# Patient Record
Sex: Female | Born: 1990 | Race: White | Hispanic: No | Smoking: Never smoker
Health system: Southern US, Community
[De-identification: ages and names within clinical notes are randomized; demographics above are authoritative.]

---

## 2015-08-06 ENCOUNTER — Emergency Department (HOSPITAL_COMMUNITY): Payer: Medicaid - Out of State

## 2015-08-06 ENCOUNTER — Emergency Department (HOSPITAL_COMMUNITY)
Admission: EM | Admit: 2015-08-06 | Discharge: 2015-08-06 | Disposition: A | Discharge status: Home / Self Care | Payer: Medicaid - Out of State | Attending: Emergency Medicine | Admitting: Emergency Medicine

## 2015-08-06 ENCOUNTER — Encounter (HOSPITAL_COMMUNITY): Payer: Self-pay | Admitting: Emergency Medicine

## 2015-08-06 DIAGNOSIS — Y998 Other external cause status: Secondary | ICD-10-CM | POA: Diagnosis not present

## 2015-08-06 DIAGNOSIS — M25561 Pain in right knee: Secondary | ICD-10-CM

## 2015-08-06 DIAGNOSIS — Y9389 Activity, other specified: Secondary | ICD-10-CM | POA: Insufficient documentation

## 2015-08-06 DIAGNOSIS — X501XXA Overexertion from prolonged static or awkward postures, initial encounter: Secondary | ICD-10-CM | POA: Diagnosis not present

## 2015-08-06 DIAGNOSIS — Y9289 Other specified places as the place of occurrence of the external cause: Secondary | ICD-10-CM | POA: Insufficient documentation

## 2015-08-06 DIAGNOSIS — S8991XA Unspecified injury of right lower leg, initial encounter: Secondary | ICD-10-CM | POA: Diagnosis not present

## 2015-08-06 MED ORDER — HYDROCODONE-ACETAMINOPHEN 5-325 MG PO TABS: 1.0000 | ORAL_TABLET | Freq: Once | ORAL | Status: DC

## 2015-08-06 MED ORDER — IBUPROFEN 400 MG PO TABS
600.0000 mg | ORAL_TABLET | Freq: Once | ORAL | Status: AC
  Administered 2015-08-06: 600 mg via ORAL
  Filled 2015-08-06: qty 1

## 2015-08-06 MED ORDER — HYDROCODONE-ACETAMINOPHEN 5-325 MG PO TABS: 1.0000 | ORAL_TABLET | Freq: Four times a day (QID) | ORAL | Status: AC | PRN

## 2015-08-06 MED ORDER — IBUPROFEN 600 MG PO TABS: 600.0000 mg | ORAL_TABLET | Freq: Three times a day (TID) | ORAL | Status: AC

## 2015-08-06 NOTE — ED Provider Notes (Signed)
CSN: [id number]     Arrival date & time 08/06/15  0122 History   First MD Initiated Contact with Patient 08/06/15 0302     Chief Complaint  Patient presents with  . Knee Injury     (Consider location/radiation/quality/duration/timing/severity/associated sxs/prior Treatment) HPI Comments: Patient states she was dancing and she twisted and felt pain in her right knee.  She sat for a period of time it felt better.  She then went up and danced again.  I repeated the movement repeating the pain. She states she has a history of nerve damage for unexplained reason and has foot drop and is supposed to wear braces but wasn't wearing them tonight  The history is provided by the patient.    History reviewed. No pertinent past medical history. History reviewed. No pertinent past surgical history. No family history on file. Social History  Substance Use Topics  . Smoking status: Never Smoker   . Smokeless tobacco: None  . Alcohol Use: No   OB History    No data available     Review of Systems  Constitutional: Negative for fever and activity change.  Musculoskeletal: Positive for joint swelling and arthralgias.  Neurological: Negative for numbness.      Allergies  Review of patient's allergies indicates no known allergies.  Home Medications   Prior to Admission medications   Medication Sig Start Date End Date Taking? Authorizing Provider  HYDROcodone-acetaminophen (NORCO/VICODIN) 5-325 MG tablet Take 1 tablet by mouth every 6 (six) hours as needed for moderate pain. 08/06/15   Earley FavorGail Audre Cenci, NP  ibuprofen (ADVIL,MOTRIN) 600 MG tablet Take 1 tablet (600 mg total) by mouth 3 (three) times daily. 08/06/15   Earley FavorGail Glen Kesinger, NP   BP 115/78 mmHg  Pulse 73  Temp(Src) 97.9 F (36.6 C) (Oral)  Resp 18  Ht 5\' 8"  (1.727 m)  Wt 64.042 kg  BMI 21.47 kg/m2  SpO2 100% Physical Exam  Constitutional: She is oriented to person, place, and time. She appears well-developed and well-nourished.  HENT:   Head: Normocephalic and atraumatic.  Eyes: Pupils are equal, round, and reactive to light.  Neck: Normal range of motion.  Cardiovascular: Normal rate.   Pulmonary/Chest: Effort normal.  Musculoskeletal: She exhibits tenderness.       Right knee: She exhibits decreased range of motion and swelling. She exhibits no effusion, no ecchymosis, no deformity, no erythema, normal alignment and no bony tenderness. Tenderness found. Lateral joint line tenderness noted. No patellar tendon tenderness noted.       Legs: Neurological: She is alert and oriented to person, place, and time.  Skin: Skin is warm.  Nursing note and vitals reviewed.   ED Course  Procedures (including critical care time) Labs Review Labs Reviewed - No data to display  Imaging Review Dg Knee Complete 4 Views Right  08/06/2015  CLINICAL DATA:  Pain after dancing tonight, twisting injury. EXAM: RIGHT KNEE - COMPLETE 4+ VIEW COMPARISON:  None. FINDINGS: There is no evidence of fracture, dislocation, or joint effusion. There is no evidence of arthropathy or other focal bone abnormality. Soft tissues are unremarkable. IMPRESSION: Negative. Electronically Signed   By: Ellery Plunkaniel R Mitchell M.D.   On: 08/06/2015 02:52   I have personally reviewed and evaluated these images and lab results as part of my medical decision-making.   EKG Interpretation None    xray normal partially flex of leg Lateral swelling + distal pulses   MDM   Final diagnoses:  Knee pain, acute, right  Earley Favor, NP 08/06/15 1610  Shon Baton, MD 08/08/15 [phone]

## 2015-08-06 NOTE — ED Notes (Signed)
Pt. injured her right knee while dancing last night , pt. stated she fell and danced again with increasing pain when moving .

## 2015-08-06 NOTE — Discharge Instructions (Signed)
You've been placed in a knee brace for comfort U been given prescription for ibuprofen and Vicodin to control your pain symptoms is very important that you call the orthopedic surgeon in the morning to set an appointment for further evaluation if you are up and about.  You should be wearing the knee brace at all times  Cryotherapy Cryotherapy is when you put ice on your injury. Ice helps lessen pain and puffiness (swelling) after an injury. Ice works the best when you start using it in the first 24 to 48 hours after an injury. HOME CARE  Put a dry or damp towel between the ice pack and your skin.  You may press gently on the ice pack.  Leave the ice on for no more than 10 to 20 minutes at a time.  Check your skin after 5 minutes to make sure your skin is okay.  Rest at least 20 minutes between ice pack uses.  Stop using ice when your skin loses feeling (numbness).  Do not use ice on someone who cannot tell you when it hurts. This includes small children and people with memory problems (dementia). GET HELP RIGHT AWAY IF:  You have white spots on your skin.  Your skin turns blue or pale.  Your skin feels waxy or hard.  Your puffiness gets worse. MAKE SURE YOU:   Understand these instructions.  Will watch your condition.  Will get help right away if you are not doing well or get worse.   This information is not intended to replace advice given to you by your health care provider. Make sure you discuss any questions you have with your health care provider.   Document Released: 09/29/2007 Document Revised: 07/05/2011 Document Reviewed: 12/03/2010 Elsevier Interactive Patient Education 2016 [employer]  How to Use a Knee Brace A knee brace is a device that you wear to support your knee, especially if the knee is healing after an injury or surgery. There are several types of knee braces. Some are designed to prevent an injury (prophylactic brace). These are often worn during  sports. Others support an injured knee (functional brace) or keep it still while it heals (rehabilitative brace). People with severe arthritis of the knee may benefit from a brace that takes some pressure off the knee (unloader brace). Most knee braces are made from a combination of cloth and metal or plastic.  You may need to wear a knee brace to:  Relieve knee pain.  Help your knee support your weight (improve stability).  Help you walk farther (improve mobility).  Prevent injury.  Support your knee while it heals from surgery or from an injury. RISKS AND COMPLICATIONS Generally, knee braces are very safe to wear. However, problems may occur, including:  Skin irritation that may lead to infection.  Making your condition worse if you wear the brace in the wrong way. HOW TO USE A KNEE BRACE Different braces will have different instructions for use. Your health care provider will tell you or show you:  How to put on your brace.  How to adjust the brace.  When and how often to wear the brace.  How to remove the brace.  If you will need any assistive devices in addition to the brace, such as crutches or a cane. In general, your brace should:  Have the hinge of the brace line up with the bend of your knee.  Have straps, hooks, or tapes that fasten snugly around your leg.  Not  feel too tight or too loose. HOW TO CARE FOR A KNEE BRACE  Check your brace often for signs of damage, such as loose connections or attachments. Your knee brace may get damaged or wear out during normal use.  Wash the fabric parts of your brace with soap and water.  Read the insert that comes with your brace for other specific care instructions. SEEK MEDICAL CARE IF:  Your knee brace is too loose or too tight and you cannot adjust it.  Your knee brace causes skin redness, swelling, bruising, or irritation.  Your knee brace is not helping.  Your knee brace is making your knee pain worse.   This  information is not intended to replace advice given to you by your health care provider. Make sure you discuss any questions you have with your health care provider.   Document Released: 07/03/2003 Document Revised: 01/01/2015 Document Reviewed: 08/05/2014 Elsevier Interactive Patient Education [employer]

## 2017-04-23 IMAGING — CR DG KNEE COMPLETE 4+V*R*
4 series · 4 of 4 positions shown · non-contrast
Comparison: None.

CLINICAL DATA: Pain after dancing tonight, twisting injury.

EXAM:
RIGHT KNEE - COMPLETE 4+ VIEW

[knee ap]
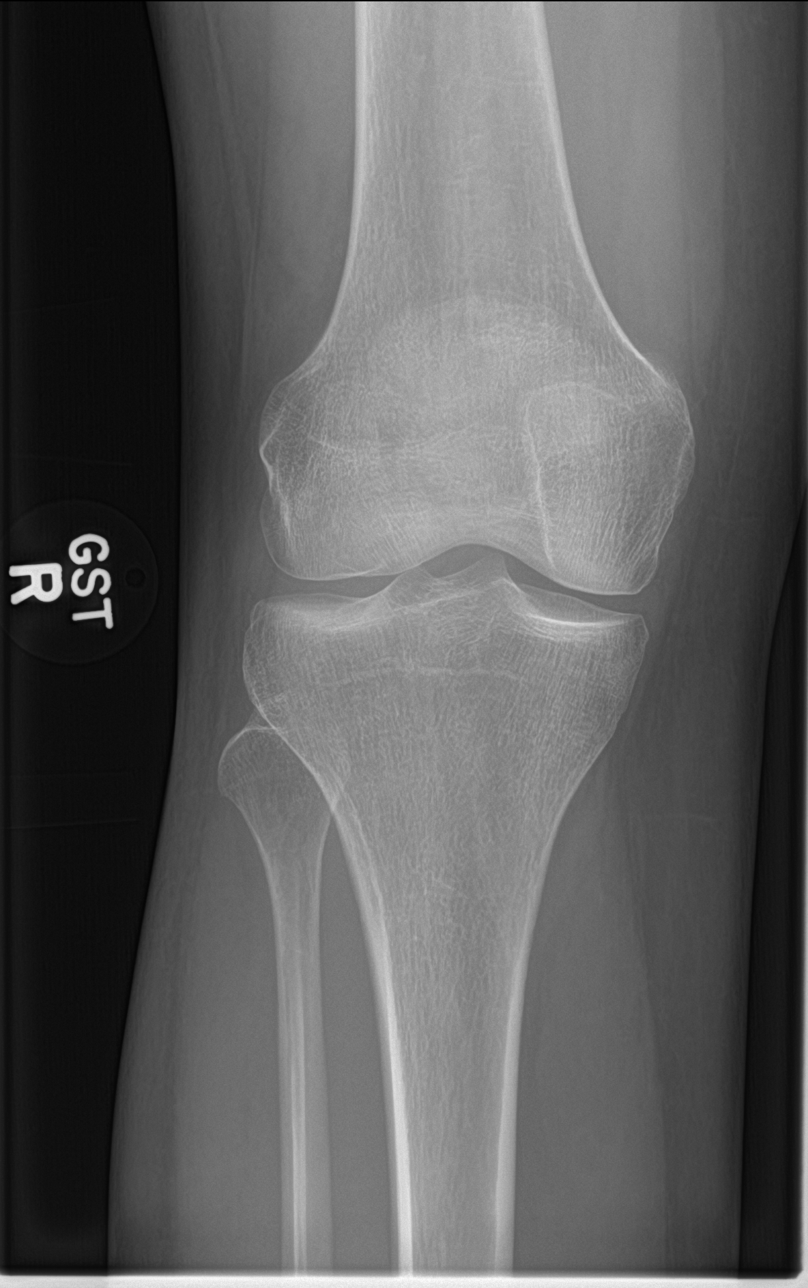

[knee lat]
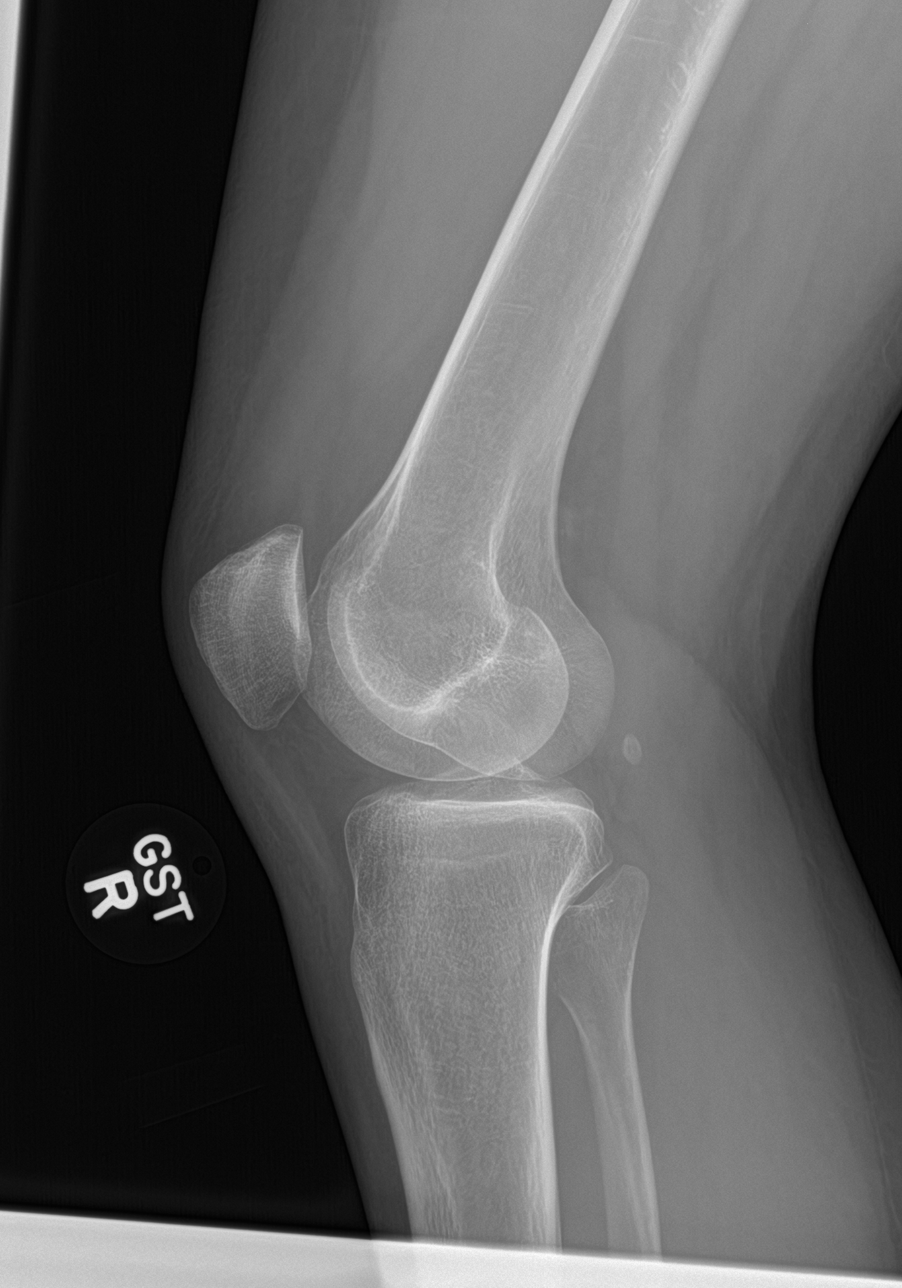

[knee obl (1 of 2)]
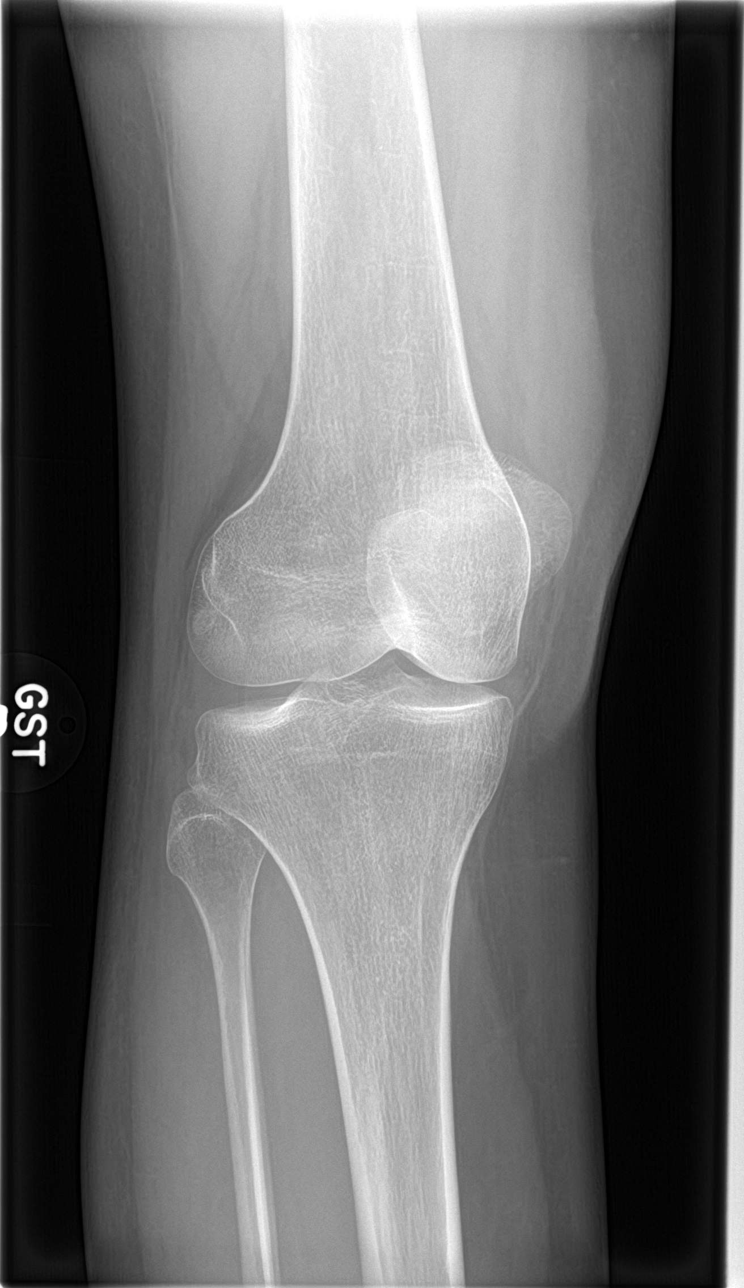

[knee obl (2 of 2)]
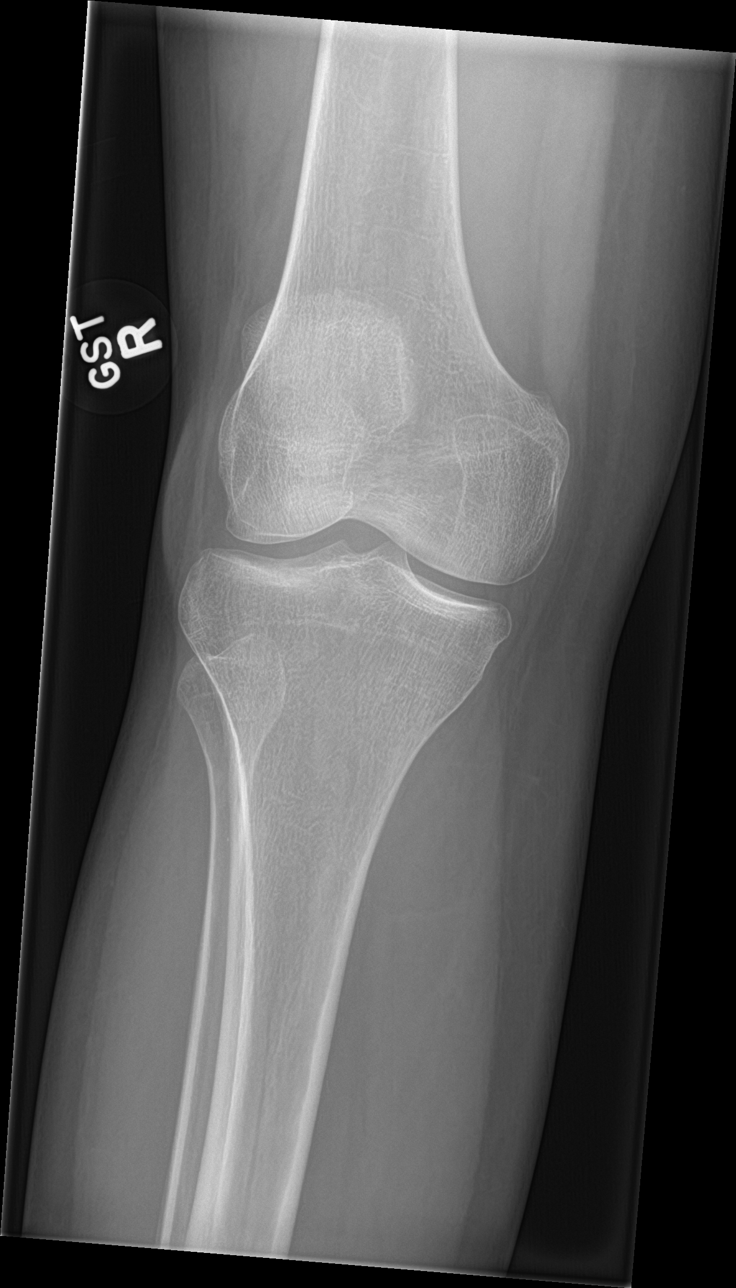

[4 of 4 positions shown; findings below may reference images not displayed]

FINDINGS: There is no evidence of fracture, dislocation, or joint effusion.
There is no evidence of arthropathy or other focal bone abnormality.
Soft tissues are unremarkable.
IMPRESSION: Negative.
